# Patient Record
Sex: Male | Born: 1935 | Race: White | Hispanic: No | Marital: Married
Health system: Southern US, Community
[De-identification: ages and names within clinical notes are randomized; demographics above are authoritative.]

---

## 2003-05-29 ENCOUNTER — Other Ambulatory Visit: Payer: Self-pay

## 2005-12-02 ENCOUNTER — Ambulatory Visit: Payer: Self-pay | Admitting: *Deleted

## 2005-12-16 ENCOUNTER — Ambulatory Visit: Payer: Self-pay | Admitting: *Deleted

## 2005-12-18 ENCOUNTER — Ambulatory Visit: Payer: Self-pay | Admitting: *Deleted

## 2006-11-10 ENCOUNTER — Ambulatory Visit: Payer: Self-pay | Admitting: Family Medicine

## 2006-12-28 ENCOUNTER — Ambulatory Visit: Payer: Self-pay | Admitting: Physician Assistant

## 2007-04-18 ENCOUNTER — Emergency Department: Payer: Self-pay | Admitting: Emergency Medicine

## 2007-08-31 ENCOUNTER — Ambulatory Visit: Payer: Self-pay | Admitting: Family Medicine

## 2007-09-07 ENCOUNTER — Ambulatory Visit: Payer: Self-pay | Admitting: Family Medicine

## 2009-01-07 ENCOUNTER — Encounter: Admission: RE | Admit: 2009-01-07 | Discharge: 2009-01-07 | Payer: Self-pay | Admitting: Neurosurgery

## 2009-01-15 ENCOUNTER — Encounter: Admission: RE | Admit: 2009-01-15 | Discharge: 2009-01-15 | Payer: Self-pay | Admitting: Neurosurgery

## 2009-01-20 ENCOUNTER — Emergency Department: Payer: Self-pay | Admitting: Internal Medicine

## 2009-02-07 ENCOUNTER — Ambulatory Visit (HOSPITAL_COMMUNITY): Admission: RE | Admit: 2009-02-07 | Discharge: 2009-02-07 | Payer: Self-pay | Admitting: Neurosurgery

## 2009-02-08 ENCOUNTER — Inpatient Hospital Stay: Payer: Self-pay | Admitting: Vascular Surgery

## 2009-04-12 ENCOUNTER — Inpatient Hospital Stay (HOSPITAL_COMMUNITY): Admission: RE | Admit: 2009-04-12 | Discharge: 2009-04-18 | Payer: Self-pay | Admitting: Neurosurgery

## 2009-04-12 ENCOUNTER — Encounter (INDEPENDENT_AMBULATORY_CARE_PROVIDER_SITE_OTHER): Payer: Self-pay | Admitting: Neurosurgery

## 2009-05-26 ENCOUNTER — Emergency Department: Payer: Self-pay | Admitting: Unknown Physician Specialty

## 2010-01-09 IMAGING — CT CT ABD-PELV W/O CM
1 series · 15 of 32 positions shown, 19 images · non-contrast
Comparison: none

REASON FOR EXAM: (1) bad epigastric paub hi wbc count hi creatnine; (2)
pain
COMMENTS:

PROCEDURE:     CT  - CT ABDOMEN AND PELVIS W[DATE]  [DATE]
RESULT:     [place] CT abdomen and pelvis
HISTORY: Pain, elevated white blood count.

[Series 2: stone · axial · 0.69mm/px · z∈[-982,-538]mm · 15 of 165 slices shown, 19 images]
[im 11/165  soft-tissue]
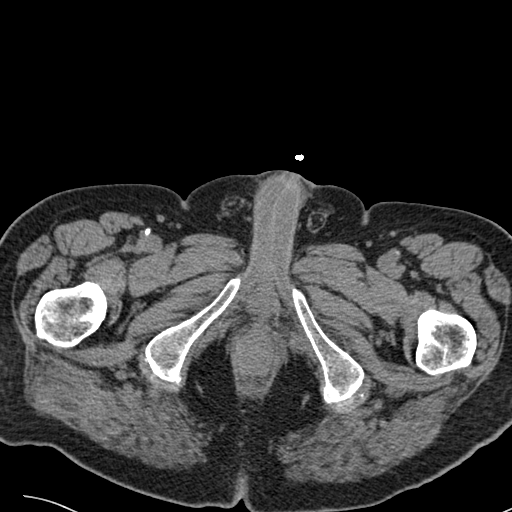
[im 11/165  bone]
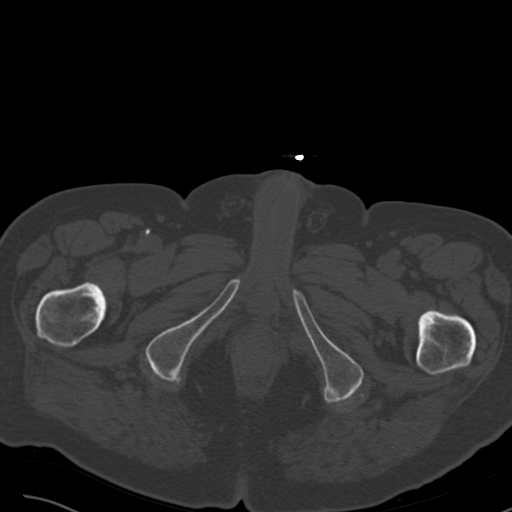
[im 22/165  soft-tissue]
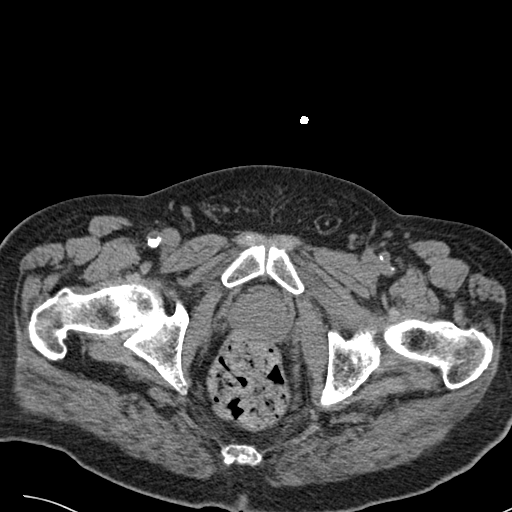
[im 32/165  soft-tissue]
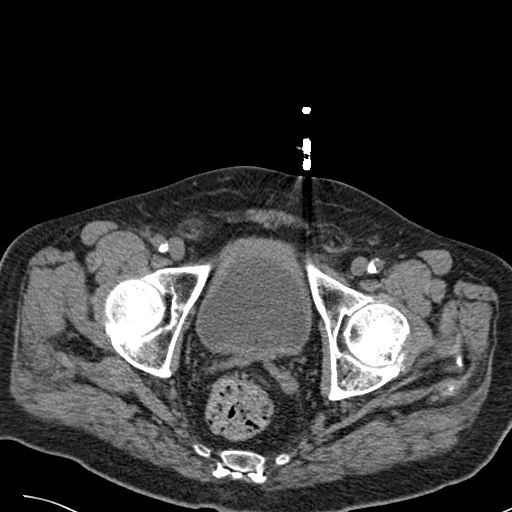
[im 48/165  soft-tissue]
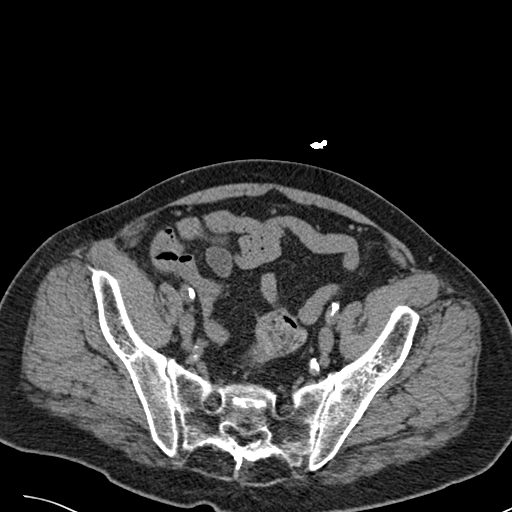
[im 59/165  soft-tissue]
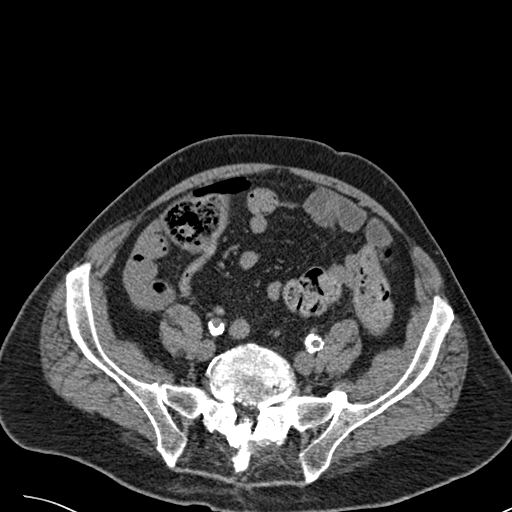
[im 69/165  soft-tissue]
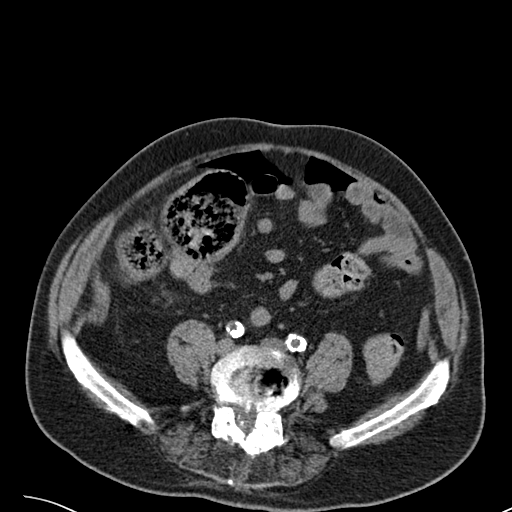
[im 85/165  soft-tissue]
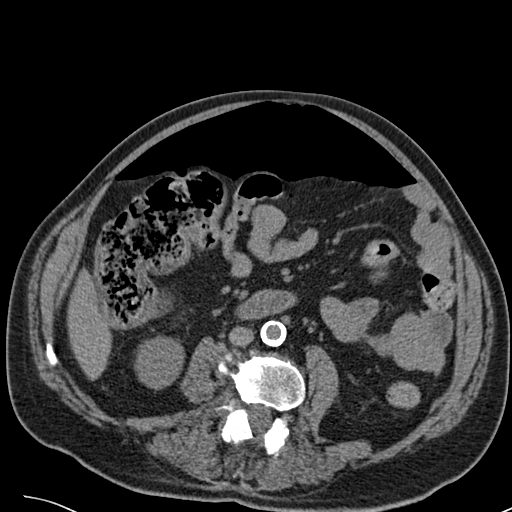
[im 96/165  soft-tissue]
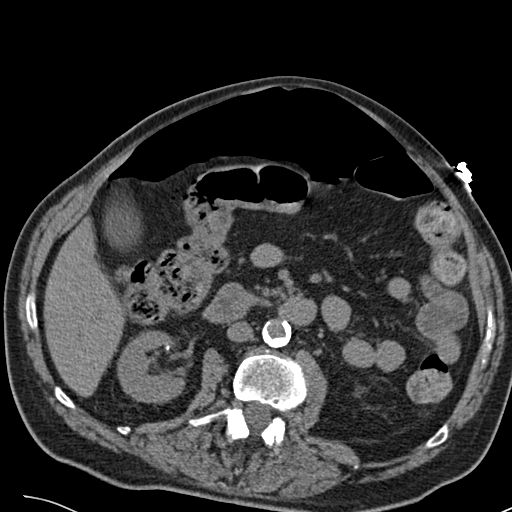
[im 106/165  soft-tissue]
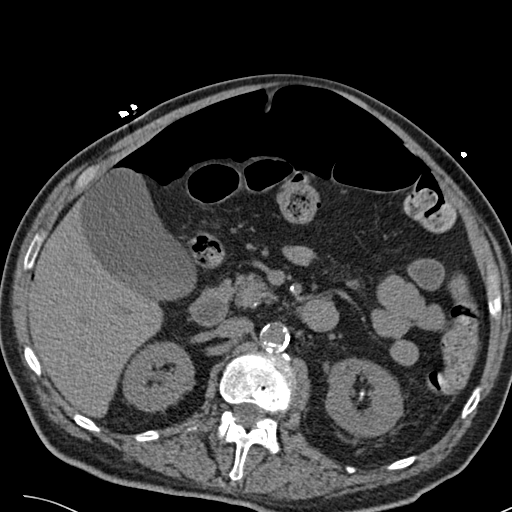
[im 106/165  bone]
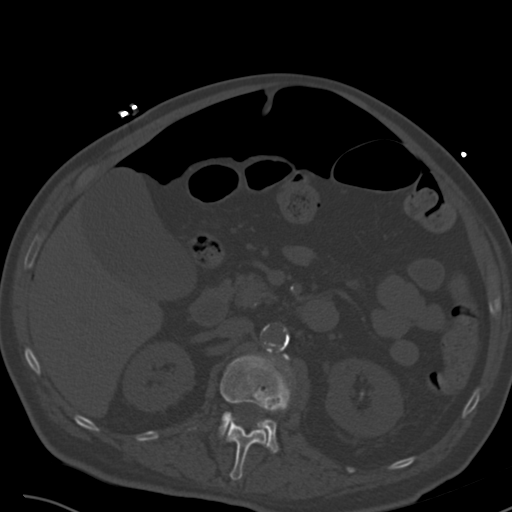
[im 117/165  soft-tissue]
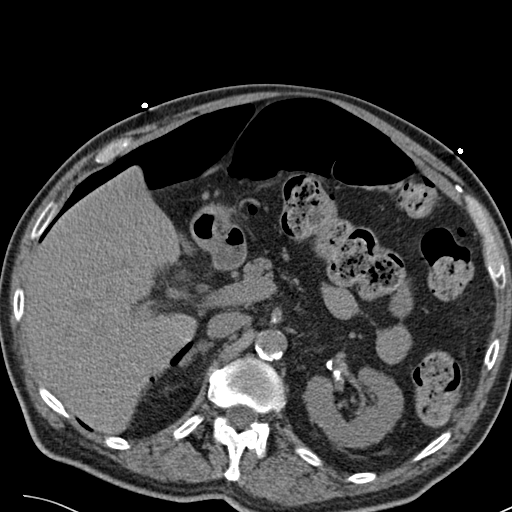
[im 133/165  soft-tissue]
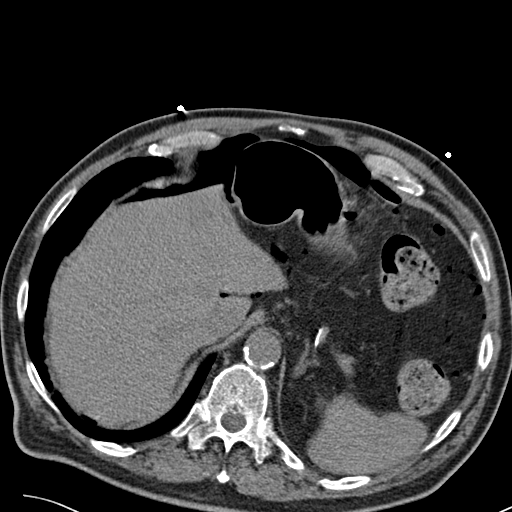
[im 143/165  soft-tissue]
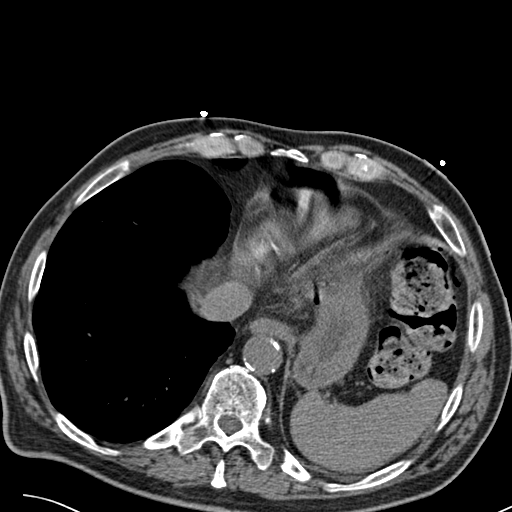
[im 143/165  lung]
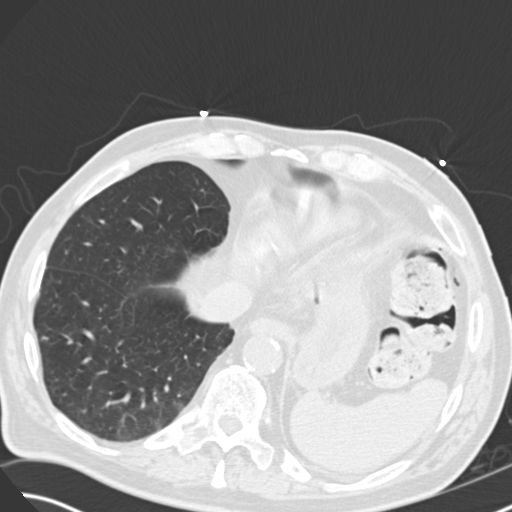
[im 149/165  lung]
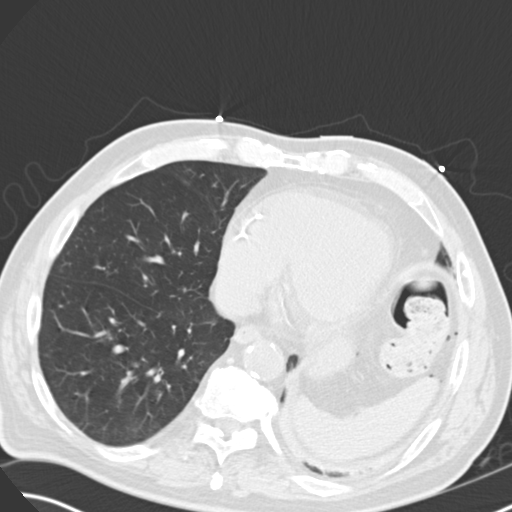
[im 154/165  soft-tissue]
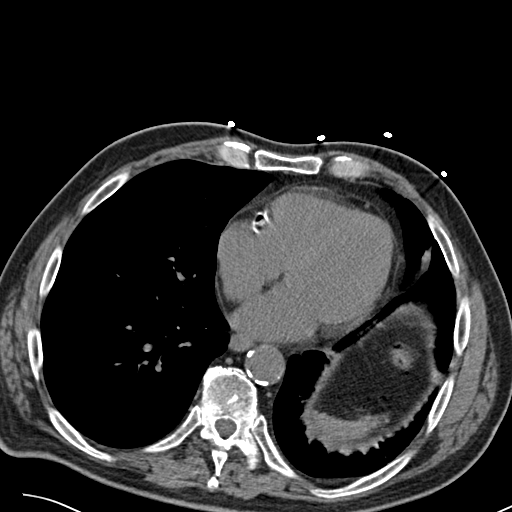
[im 154/165  lung]
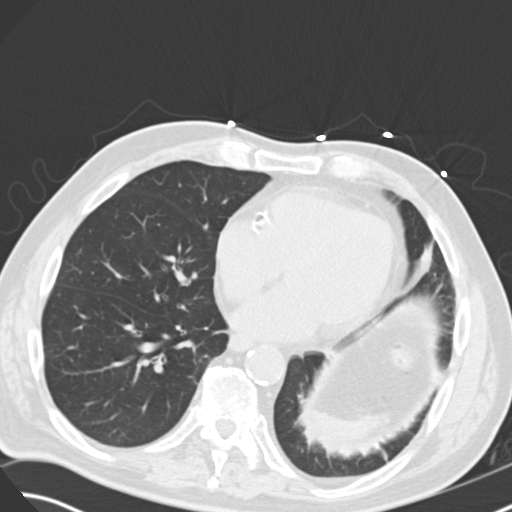
[im 159/165  lung]
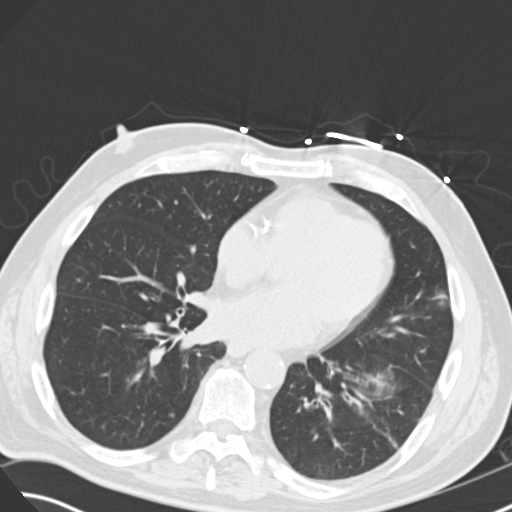

[15 of 32 positions shown; findings below may reference images not displayed]

Procedure: Standard nonenhanced CT of the abdomen and pelvis obtained. Liver
is normal. Gallbladder is distended and contains stones and/or sludge.
Gallbladder wall thickness is normal. Spleen is normal. Bilateral
nephrolithiasis is present. No hydronephrosis noted. Appendix is normal. No
inguinal adenopathy noted. Bladder nondistended. Aortoiliac stents are
noted. Basilar atelectasis present. Free intraperitoneal air is noted. This
suggests the possibility of bowel rupture. This exam was evaluated on
separate workstation using [employer].
IMPRESSION: Free intraperitoneal air. Aortoiliac vascular disease is
present, the patient has aorto iliac stents, also noted is mesenteric
vascular disease and renal vascular disease. No prominent bowel distention
or bowel wall thickening noted.

2. Gallbladder distention with sludge and/or gallstones. This report was
phoned to the patient's physician at the time of study.

## 2010-02-14 ENCOUNTER — Ambulatory Visit: Payer: Self-pay | Admitting: Family Medicine

## 2010-02-26 ENCOUNTER — Ambulatory Visit: Payer: Self-pay | Admitting: Family Medicine

## 2010-03-06 ENCOUNTER — Ambulatory Visit: Payer: Self-pay | Admitting: Vascular Surgery

## 2010-03-19 ENCOUNTER — Ambulatory Visit: Payer: Self-pay | Admitting: Vascular Surgery

## 2010-04-02 ENCOUNTER — Inpatient Hospital Stay: Payer: Self-pay | Admitting: Vascular Surgery

## 2010-08-18 ENCOUNTER — Encounter: Payer: Self-pay | Admitting: Neurosurgery

## 2010-10-16 ENCOUNTER — Ambulatory Visit: Payer: Self-pay | Admitting: Otolaryngology

## 2010-10-20 ENCOUNTER — Ambulatory Visit: Payer: Self-pay | Admitting: Otolaryngology

## 2010-10-31 LAB — BASIC METABOLIC PANEL
BUN: 31 mg/dL — ABNORMAL HIGH (ref 6–23)
Chloride: 101 mEq/L (ref 96–112)
Creatinine, Ser: 1.47 mg/dL (ref 0.4–1.5)
GFR calc Af Amer: 57 mL/min — ABNORMAL LOW (ref >60)
GFR calc non Af Amer: 47 mL/min — ABNORMAL LOW (ref >60)

## 2010-10-31 LAB — CBC
MCV: 98 fL (ref 78.0–100.0)
Platelets: 194 10*3/uL (ref 150–400)
RBC: 3.81 MIL/uL — ABNORMAL LOW (ref 4.22–5.81)
WBC: 7.7 10*3/uL (ref 4.0–10.5)

## 2010-10-31 LAB — GLUCOSE, CAPILLARY
Glucose-Capillary: 107 mg/dL — ABNORMAL HIGH (ref 70–99)
Glucose-Capillary: 117 mg/dL — ABNORMAL HIGH (ref 70–99)
Glucose-Capillary: 129 mg/dL — ABNORMAL HIGH (ref 70–99)
Glucose-Capillary: 131 mg/dL — ABNORMAL HIGH (ref 70–99)
Glucose-Capillary: 93 mg/dL (ref 70–99)

## 2010-10-31 LAB — DIFFERENTIAL
Lymphocytes Relative: 29 % (ref 12–46)
Lymphs Abs: 2.2 10*3/uL (ref 0.7–4.0)
Monocytes Relative: 9 % (ref 3–12)
Neutro Abs: 3.8 10*3/uL (ref 1.7–7.7)
Neutrophils Relative %: 50 % (ref 43–77)

## 2010-11-02 LAB — CBC
HCT: 32.6 % — ABNORMAL LOW (ref 39.0–52.0)
Hemoglobin: 11.7 g/dL — ABNORMAL LOW (ref 13.0–17.0)
MCHC: 35.9 g/dL (ref 30.0–36.0)
MCV: 98.5 fL (ref 78.0–100.0)
RBC: 3.31 MIL/uL — ABNORMAL LOW (ref 4.22–5.81)
RDW: 12.6 % (ref 11.5–15.5)

## 2010-11-02 LAB — BASIC METABOLIC PANEL
CO2: 23 mEq/L (ref 19–32)
Calcium: 9.7 mg/dL (ref 8.4–10.5)
Chloride: 105 mEq/L (ref 96–112)
GFR calc Af Amer: 49 mL/min — ABNORMAL LOW (ref >60)
Glucose, Bld: 107 mg/dL — ABNORMAL HIGH (ref 70–99)
Potassium: 4.5 mEq/L (ref 3.5–5.1)
Sodium: 136 mEq/L (ref 135–145)

## 2010-12-16 ENCOUNTER — Ambulatory Visit: Payer: Self-pay | Admitting: Neurology

## 2012-01-08 LAB — URINALYSIS, COMPLETE
Bilirubin,UR: NEGATIVE
Hyaline Cast: 6
Ketone: NEGATIVE
Nitrite: NEGATIVE
Squamous Epithelial: NONE SEEN

## 2012-01-08 LAB — COMPREHENSIVE METABOLIC PANEL
Albumin: 3.3 g/dL — ABNORMAL LOW (ref 3.4–5.0)
Chloride: 103 mmol/L (ref 98–107)
Co2: 32 mmol/L (ref 21–32)
Creatinine: 1.22 mg/dL (ref 0.60–1.30)
EGFR (African American): >60
EGFR (Non-African Amer.): 57 — ABNORMAL LOW
Glucose: 164 mg/dL — ABNORMAL HIGH (ref 65–99)
Osmolality: 288 (ref 275–301)
SGOT(AST): 19 U/L (ref 15–37)
Sodium: 141 mmol/L (ref 136–145)
Total Protein: 6.1 g/dL — ABNORMAL LOW (ref 6.4–8.2)

## 2012-01-08 LAB — CBC
HCT: 38.4 % — ABNORMAL LOW (ref 40.0–52.0)
HGB: 13.4 g/dL (ref 13.0–18.0)
MCH: 33.4 pg (ref 26.0–34.0)
MCHC: 35 g/dL (ref 32.0–36.0)
Platelet: 147 10*3/uL — ABNORMAL LOW (ref 150–440)
RBC: 4.03 10*6/uL — ABNORMAL LOW (ref 4.40–5.90)
RDW: 12.5 % (ref 11.5–14.5)

## 2012-01-08 LAB — MAGNESIUM: Magnesium: 2 mg/dL

## 2012-01-08 LAB — TROPONIN I: Troponin-I: <0.02 ng/mL

## 2012-01-08 LAB — CK TOTAL AND CKMB (NOT AT ARMC): CK-MB: 1.8 ng/mL (ref 0.5–3.6)

## 2012-01-09 LAB — LIPID PANEL
Cholesterol: 171 mg/dL (ref 0–200)
HDL Cholesterol: 64 mg/dL — ABNORMAL HIGH (ref 40–60)
Ldl Cholesterol, Calc: 78 mg/dL (ref 0–100)
Triglycerides: 143 mg/dL (ref 0–200)
VLDL Cholesterol, Calc: 29 mg/dL (ref 5–40)

## 2012-01-09 LAB — TSH: Thyroid Stimulating Horm: 2.5 u[IU]/mL

## 2012-01-11 LAB — AMMONIA: Ammonia, Plasma: 31 mcmol/L (ref 11–32)

## 2012-01-11 LAB — FOLATE: Folic Acid: 90.6 ng/mL (ref 3.1–100.0)

## 2012-01-11 LAB — HEPATIC FUNCTION PANEL A (ARMC)
Bilirubin, Direct: 0.2 mg/dL (ref 0.00–0.20)
Bilirubin,Total: 1.1 mg/dL — ABNORMAL HIGH (ref 0.2–1.0)
SGPT (ALT): 18 U/L
Total Protein: 5.7 g/dL — ABNORMAL LOW (ref 6.4–8.2)

## 2012-01-12 ENCOUNTER — Inpatient Hospital Stay: Payer: Self-pay | Admitting: Internal Medicine

## 2012-04-26 DEATH — deceased

## 2014-11-18 NOTE — Consult Note (Signed)
General Aspect carotid stenosis    Present Illness The patient is a 81 year old male with a history of carotid stenosis, status post right CEA, hypertension, diabetes, hyperlipidemia, bladder cancer, presented to the ED with confusion and difficulty with speach. Apparently per his wife at the time of admission he  was confused and unable to remember things. In addition, the patient has had slurred speech. He was noted to have high blood pressure with systolic pressures greater than 200 today. No loss of consciousness, or incontinence was noted. He never had a stroke before.  The patient denies AF or focal motor deficits.  PAST MEDICAL HISTORY:  1. Hypertension. 2. Diabetes. 3. Hyperlipidemia. 4. Right-sided carotid artery stenosis, status post surgery.  5. Bladder cancer.   PAST SURGICAL HISTORY:  1. Back surgery.  2. Knee surgery. 3. Aortoiliac stent.  4. RightCarotid Endarterectomy.   Home Medications: Medication Instructions Status  multivitamin 1 tab(s) orally once a day Active  aspirin 81 mg oral enteric coated tablet 1 tab(s) orally once a day Active  gabapentin 300 mg oral capsule 1 cap(s) orally 3 times a day Active  donepezil 10 mg oral tablet 1 tab(s) orally once a day Active  ibuprofen 200 mg oral tablet 2 tab(s) orally 3 times a day Active  pravastatin 80 mg oral tablet 1 tab(s) orally once a day Active  lorazepam 1 mg oral tablet 1/4 tab(s) orally 3 times a day Active  Flomax 0.4 mg oral capsule 1 cap(s) orally once a day Active  loratadine 10 mg oral tablet 1 tab(s) orally once a day Active  Dulcolax Stool Softener 100 mg oral capsule 2 cap(s) orally once a day Active  metformin 500 mg oral tablet 1 tab(s) orally once a day Active  chlorthalidone 25 mg oral tablet 1 tab(s) orally once a day Active  clonidine 0.1 mg oral tablet 1 tab(s) orally every 8 hours Active  30 l-methyl 103m 1 tab(s) orally once a day Active    Morphine: Hallucinations  Oxycodone:  Hallucinations  Tramadol: Hallucinations  Crestor: Unknown  atorvastatin: Unknown  Case History:   Family History Non-Contributory    Social History negative tobacco, negative ETOH, negative Illicit drugs   Review of Systems:   Fever/Chills No    Cough No    Sputum No    Abdominal Pain No    Diarrhea No    Constipation No    Nausea/Vomiting No    SOB/DOE No    Chest Pain No    Telemetry Reviewed NSR    Dysuria No   Physical Exam:   GEN well developed, well nourished, no acute distress    HEENT pink conjunctivae, PERRL, moist oral mucosa    NECK supple  trachea midline  right CEA well healed incision    RESP normal resp effort  no use of accessory muscles    CARD regular rate  positive carotid bruits  no JVD    ABD denies tenderness  soft    EXTR negative cyanosis/clubbing, negative edema    SKIN normal to palpation, No rashes, No ulcers    NEURO follows commands, aphasic, motor/sensory function intact    PSYCH alert, good insight   Nursing/Ancillary Notes: **Vital Signs.:   15-Jun-13 11:40   Vital Signs Type Routine   Temperature Temperature (F) 98   Celsius 36.6   Temperature Source oral   Pulse Pulse 65   Pulse source per vital sign device   Respirations Respirations 18   Systolic BP  Systolic BP 086   Diastolic BP (mmHg) Diastolic BP (mmHg) 67   Mean BP 102   BP Source vital sign device   Pulse Ox % Pulse Ox % 97   Pulse Ox Activity Level  At rest   Oxygen Delivery Room Air/ 21 %   Thyroid:  15-Jun-13 05:31    Thyroid Stimulating Hormone 2.50 (0.45-4.50 (International Unit)  ----------------------- Pregnant patients have  different reference  ranges for TSH:  - - - - - - - - - -  Pregnant, first trimetser:  0.36 - 2.50 uIU/mL)  Hepatic:  14-Jun-13 09:35    Bilirubin, Total 0.9   Alkaline Phosphatase 70   SGPT (ALT) 21 (12-78 NOTE: NEW REFERENCE RANGE 06/19/2011)   SGOT (AST) 19   Total Protein, Serum  6.1   Albumin,  Serum  3.3  Routine Chem:  14-Jun-13 09:35    Magnesium, Serum 2.0 (1.8-2.4 THERAPEUTIC RANGE: 4-7 mg/dL TOXIC: > 10 mg/dL  -----------------------)   Glucose, Serum  164   BUN  20   Creatinine (comp) 1.22   Sodium, Serum 141   Potassium, Serum 3.5   Chloride, Serum 103   CO2, Serum 32   Calcium (Total), Serum 9.4   Osmolality (calc) 288   eGFR (African American) >60   eGFR (Non-African American)  57 (eGFR values <65m/min/1.73 m2 may be an indication of chronic kidney disease (CKD). Calculated eGFR is useful in patients with stable renal function. The eGFR calculation will not be reliable in acutely ill patients when serum creatinine is changing rapidly. It is not useful in  patients on dialysis. The eGFR calculation may not be applicable to patients at the low and high extremes of body sizes, pregnant women, and vegetarians.)   Anion Gap  6  15-Jun-13 05:31    Hemoglobin A1c ([facility]) 5.7 (The American Diabetes Association recommends that a primary goal of therapy should be <7% and that physicians should reevaluate the treatment regimen in patients with HbA1c values consistently >8%.)   Cholesterol, Serum 171   Triglycerides, Serum 143   HDL (INHOUSE)  64   VLDL Cholesterol Calculated 29   LDL Cholesterol Calculated 78 (Result(s) reported on 09 Jan 2012 at 06:56AM.)  Cardiac:  14-Jun-13 09:35    CK, Total 51   CPK-MB, Serum 1.8 (Result(s) reported on 08 Jan 2012 at 10:30AM.)   Troponin I < 0.02 (0.00-0.05 0.05 ng/mL or less: NEGATIVE  Repeat testing in 3-6 hrs  if clinically indicated. >0.05 ng/mL: POTENTIAL  MYOCARDIAL INJURY. Repeat  testing in 3-6 hrs if  clinically indicated. NOTE: An increase or decrease  of 30% or more on serial  testing suggests a  clinically important change)  Routine UA:  14-Jun-13 10:54    Color (UA) Yellow   Clarity (UA) Hazy   Glucose (UA) 50 mg/dL   Bilirubin (UA) Negative   Ketones (UA) Negative   Specific Gravity (UA) 1.012    Blood (UA) Negative   pH (UA) 6.0   Protein (UA) >=500   Nitrite (UA) Negative   Leukocyte Esterase (UA) Negative (Result(s) reported on 08 Jan 2012 at 11:33AM.)   RBC (UA) <1 /HPF   WBC (UA) 1 /HPF   Bacteria (UA) NONE SEEN   Epithelial Cells (UA) NONE SEEN   Mucous (UA) PRESENT   Hyaline Cast (UA) 6 /LPF (Result(s) reported on 08 Jan 2012 at 11:33AM.)  Routine Hem:  14-Jun-13 09:35    WBC (CBC) 6.7   RBC (CBC)  4.03  Hemoglobin (CBC) 13.4   Hematocrit (CBC)  38.4   Platelet Count (CBC)  147 (Result(s) reported on 08 Jan 2012 at 10:19AM.)   MCV 96   MCH 33.4   MCHC 35.0   RDW 12.5     Impression 1.  Right carotid stenosis associated with aphasia and  confusion           given the results of the duplex ultrasound the patient should have a CTA to define the right sided lesion and exclude a left sided lesion (given aphasia in a right handed person) 2.  CVA  although the MRI does not show an infarct he continues to have aphasia           continue antiplatelet Rx           plan per [facility] and neurology 3.  Hypertension           continue meds as ordered           avoid hypotension 4.  Hypercholesterolemia           continue statin and fish oil 5.  Diabetes            ADA diet            sliding scale as needed 6.  BPH            continue flomax 7.  Neuropathy            continue neurontin    Plan level 5   Electronic Signatures: Hortencia Pilar (MD)  (Signed 15-Jun-13 17:21)  Authored: General Aspect/Present Illness, Home Medications, Allergies, History and Physical Exam, Vital Signs, Labs, Impression/Plan   Last Updated: 15-Jun-13 17:21 by Hortencia Pilar (MD)

## 2014-11-18 NOTE — H&P (Signed)
PATIENT NAME:  Frank Mcfarland, Frank Mcfarland MR#:  [id number] DATE OF BIRTH:  August 12, 1932  DATE OF ADMISSION:  01/08/2012  PRIMARY CARE PHYSICIAN: Demetrios Isaacs. Sherrie Mustache, MD   REFERRING PHYSICIAN: Malachy Moan, MD    CHIEF COMPLAINT: Confusion, memory loss, and weakness for the past three days.   HISTORY OF PRESENT ILLNESS: The patient is a 81 year old Caucasian male with a history of carotid stenosis, status post carotidectomy, hypertension, diabetes, hyperlipidemia, bladder cancer, presented to the ED with the above chief complaint. The patient is confused but awake, in no acute distress. He could not provide detailed information. According to his wife, the patient was noted to be confused and unable to remember things, and with weakness for the past three days. In addition, the patient has had mild slurred speech. He was noted to have a high blood pressure if more than 200 today. He was treated with clonidine, but the confusion is getting worse after taking the medication.  In addition, the patient has bilateral leg weakness; but no syncope, loss of consciousness, or incontinence was noted. He never had a stroke before.   PAST MEDICAL HISTORY:  1. Hypertension. 2. Diabetes. 3. Hyperlipidemia. 4. Right-sided carotid artery stenosis, status post surgery.  5. Bladder cancer.   PAST SURGICAL HISTORY:  1. Back surgery.  2. Knee surgery. 3. Aortoiliac stent.  4. Carotid arterial stenosis, status post carotidectomy.    SOCIAL HISTORY: According to the patient's wife, no smoking or drinking or illicit drugs. He is living with his wife.   FAMILY HISTORY: Mother had a stroke, otherwise unknown  ALLERGIES: Atorvastatin, Crestor, morphine, oxycodone, tramadol.   MEDICATIONS:  1. 30-I methyl 2 mg p.o. once daily. 2. Aspirin 81 mg p.o. daily.  3. Chlorthalidone 25 mg p.o. daily.  4. Clonidine 0.1 mg p.o. every 8 hours.  5. Donepezil 10 mg p.o. daily.  6. Dulcolax stool softener 100 mg, 2 capsules p.o. daily.   7. Flomax 0.4 mg p.o. daily.  8. Gabapentin 300 mg p.o. t.i.d.  9. Ibuprofen 200 mg, 2 tablets p.o. t.i.d.  10. Loratadine 10 mg p.o. once daily.  11. Lorazepam 1 mg p.o., 1/4 tab  p.o. t.i.d.  12. Metformin 500 mg p.o. once daily.  13. Multivitamin 1 tablet p.o. daily.  14. Pravastatin 80 mg p.o. daily.   REVIEW OF SYSTEMS: The patient is confused and unable to provide review of systems.   PHYSICAL EXAMINATION:  VITALS: Temperature 97.6, blood pressure 139/50, pulse 52, respirations 13, respiration 16, oxygen saturation 95% on room air.   GENERAL: The patient is awake, alert but confused, looks lethargic and had some mild slurred speech.   HEENT: Pupils are round, equal, and reactive to light and accommodation. Moist oral mucosa. Clear oropharynx.   NECK: Supple. No JVD but has right-sided carotid bruit. No thyromegaly and no lymphadenopathy.   CARDIOVASCULAR: S1, S2, regular rate and rhythm. No murmurs or gallops.   PULMONARY: Bilateral air entry. No wheezing or rales. No use of accessory muscles to breathe.   ABDOMEN: Soft. No distention or tenderness. No organomegaly. Bowel sounds are present.  EXTREMITIES: No edema, clubbing, or cyanosis. No calf tenderness. Strong bilateral pedal pulses.   NEUROLOGIC: The patient is awake, alert but confused, only limited exam. Power  five out of five bilateral upper extremities but about one out of five bilateral lower extremities. Deep tendon reflexes are mute.   LABORATORY, DIAGNOSTIC AND RADIOLOGICAL DATA:  Urinalysis is negative.  CAT scan of head: No acute intracranial process.  Chest x-ray: Elevated left hemidiaphragm without evidence of acute cardiopulmonary disease. CK 55, CK-MB 1.8.  Glucose 164, BUN 20, creatinine 1.22. Electrolytes are normal.  WBC 6.7, hemoglobin 13.4, platelets 147.  Troponin less than 0.02.  Magnesium 2.0.  EKG showed sinus bradycardia at 55 beats per minute with right bundle branch block.   IMPRESSION:   1. Altered mental status, unclear etiology, need to rule out cerebrovascular accident, also possibly due to medication.  2. Hypertension, uncontrolled.  3. Mild dehydration.  4. Diabetes.  5. Hyperlipidemia.  6. History of right-sided carotid arterial stenosis, status post surgery.   PLAN OF TREATMENT:  1. The patient will be placed for observation. We will keep n.p.o. until swallowing study recommendations.  2. Continue telemetry monitor. We will give IV fluid support but hold lorazepam, gabapentin, and ibuprofen due to altered mental status.  3. To rule out cerebrovascular accident, we will get MRI of the brain without contrast, carotid duplex, echocardiograph and check lipid panel and TSH.  4. We will increase aspirin to 325 mg p.o. daily, continue pravastatin, Lopressor, clonidine and chlorthalidone for uncontrolled high blood pressure.  5. For diabetes, we will start sliding scale, hold metformin and check hemoglobin A1c.   6. GI and deep vein thrombosis prophylaxis.   I discussed the patient's situation and plan of treatment with the patient's wife.  TIME SPENT: About 70 minutes   ____________________________ Shaune PollackQing Drena Ham, MD qc:cbb D: 01/08/2012 14:02:32 ET T: 01/08/2012 15:17:36 ET JOB#: [id number]  cc: Shaune PollackQing Brylea Pita, MD, <Dictator> Demetrios Isaacsonald E. Sherrie MustacheFisher, MD Shaune PollackQING Kiandra Sanguinetti MD ELECTRONICALLY SIGNED 01/10/2012 14:45

## 2014-11-18 NOTE — Discharge Summary (Signed)
PATIENT NAME:  Frank Mcfarland, Frank Mcfarland MR#:  [id number] DATE OF BIRTH:  Sep 15, 1932  DATE OF ADMISSION:  01/08/2012 DATE OF DISCHARGE:  01/13/2012  DISCHARGE DIAGNOSES: 1. Progressive confusion and altered mental status, possibly due to Lewy body dementia and partial seizures in the frontal region. 2. Hypertension. 3. Diabetes. 4. Hyperlipidemia. 5. Peripheral vascular disease with right CEA angiogram done in the hospital showed left internal carotid about 30 to 40% and 50% ostial stenosis. Medical management recommended.   DISPOSITION: The patient is being discharged to a rehab facility.   DIET: Low sodium, ADA diet.   ACTIVITY: As tolerated.   DISCHARGE FOLLOWUP: Followup with Dr. Sherryll BurgerShah, neurologist, in 1 to 2 months. Followup with Dr. Wyn Quakerew and Dr. Sherrie MustacheFisher as an outpatient.   DISCHARGE MEDICATIONS:  1. Multivitamin 1 tablet daily.  2. Neurontin 300 mg three times daily. 3. Aricept 10 mg daily.  4. Pravachol 80 mg daily. 5. Flomax 0.5 mg daily.  6. Dulcolax  5 mg twice a day, hold for loose stools.  7. Metformin 500 mg daily.  8. Chlorthalidone 25 mg daily.  9. Clonidine 0.1 mg every eight hours. 10. Aspirin 325 mg daily.  11. NovoLog insulin sliding scale.  12. Colace 200 mg twice a day, hold for loose stools.  13. Hydralazine 25 mg twice a day, hold for systolic blood pressure less than 90.  14. Ativan 0.25 mg three times daily. 15. Trileptal 300 mg twice a day until 01/14/2012 and then 600 mg twice a day. 16. Symbicort 160/4.5 one puff twice a day. 17. Klonopin 0.5 mg at bedtime. 18. Zofran ODT 4 mg every 6 hours p.r.n.  19. Tylenol 650 mg every six hours p.r.n.   CONSULTANTS:  1. Mellody DrownMatthew Smith, MD - Neurology. 2. Levora DredgeGregory Schnier, MD - Vascular.  RESULTS: EEG showed seizure activity in the left frontal region consistent with simple partial seizures.   Echocardiogram: Normal LVF, ejection fraction 50%, mild MR, mild TR. No apparent source of CVA.   CT of the head without  contrast showed no acute abnormalities.   MRI of the brain showed no evidence of any acute stroke, chronic white matter ischemic changes.   Carotid ultrasound showed 80% stenosis on the right side. There was 80% stenosis on the right side and no hemodynamically significant stenosis on the left side   CT angioma showed considerable plaque throughout the common carotid arteries. Right carotid artery dilates to 1.1 cm at the bulb. Origin of the right internal carotid artery does not demonstrate calcified plaque, but there is mild stenosis in the range of 50%. On the left, there is calcified plaque within the wall of the common carotid artery, especially at the level of the bulb.   Chest x-ray showed no acute abnormalities.   Vitamin B12 was normal. RPR was negative. CBC was normal other than platelet count of 147. Folic acid normal. Glucose [id number]. Hemoglobin A1c 5.7. Ammonia level 31. LDL 78, VLDL 29, cholesterol 171, and triglycerides 143. LFTs normal. TSH normal. Cardiac enzymes normal.  Angiogram done in special procedures showed 30 to 40% stenosis in the left internal carotid and 50% stenosis at the bulb. Medical management was advised.   HOSPITAL COURSE: The patient is a 81 year old male with past medical history of hypertension, diabetes, hyperlipidemia, peripheral vascular disease status post right CEA, and bladder cancer who presented with weakness and confusion. In the past, the patient had been diagnosed with Lewy body dementia by Dr. Sherryll BurgerShah and had been started on Aricept. He  was admitted to the hospital and had extensive work-up including CT of the head, MRI of the brain, and echo all of which were essentially normal. His RPR, B12, folate, TSH, and LFTs were also normal; however, the patient continued to have baseline confusion. Therefore, a neurology consultation with Dr. Katrinka Blazing was obtained who felt the patient possibly had Lewy body dementia. He ordered an EEG which showed simple partial  seizures in the frontal region. Dr. Katrinka Blazing recommended starting the patient on Trileptal and Klonopin and avoid neuroleptics. The patient should follow up with Dr. Sherryll Burger in 1 to 2 months to discuss initiation of Sinemet. The patient's hypertension was initially uncontrolled. He was started on hydralazine, diuretics, and clonidine with good control. The patient's diabetes remained well controlled during the hospitalization. His hemoglobin A1c is 5.7. He is on a statin therapy and his LDL is at goal. The patient had history of right carotid endarterectomy. Due to his presenting symptoms, a carotid ultrasound was obtained which showed about 80% stenosis on the right side, the site where he had his prior endarterectomy. Therefore, a vascular surgical consultation was obtained who ordered a CT angiogram. The CT angiogram results were ambiguous, therefore, an angiogram was done on the left side which showed 30 to 40% stenosis and 50% stenosis at the ostium. Medical management and outpatient follow-up was recommended by vascular surgery. The patient is being discharged in a stable condition.   TIME SPENT: 45 minutes.  ____________________________ Darrick Meigs, MD sp:slb D: 01/13/2012 07:32:00 ET T: 01/13/2012 08:11:36 ET JOB#: [id number]  cc: Darrick Meigs, MD, <Dictator> Darrick Meigs MD ELECTRONICALLY SIGNED 01/14/2012 13:18

## 2014-11-18 NOTE — Consult Note (Signed)
Referring Physician:  Demetrios Loll :   Primary Care Physician:  Demetrios Loll : [facility], [address], [place], [place] [address], [place] [phone]  Reason for Consult:  Admit Date: 11-Jan-2012   Chief Complaint: altered mental status   Reason for Consult: behavior changes   History of Present Illness:  History of Present Illness:   81 yo RHD M presents to [facility] secondary to altered mental status for 3 days and being unable to get up.  Family notes that he has a baseline of dementia and is seen by Dr. Manuella Ghazi who placed him on Aricept for possible Lewy Body dementia.  The family notes that he has been on Aricept for the past few months and that this has not changed him.  He currently lives with his wife who does most of his ADL's.  Family notes that baseline, he has memory issues and has difficulty ambulating to the point that he needs a walker however, 3 days ago things got a lot worse.  Family desires to take him back home.  Family also notes that he has had more hallucinations lately and pt is also having more episodes of fighting in his sleep to the point that his wife does not sleep in the same room anymore.  ROS:   General weakness    HEENT no complaints    Lungs no complaints    Cardiac no complaints    GI no complaints    GU no complaints    Musculoskeletal back pain    Extremities no complaints    Skin no complaints    Neuro memory difficulties    Endocrine no complaints    Psych no complaints   Past Medical/Surgical Hx:  COPD:   Asthma:   Depression:   Anxiety:   Peripheral Vascular Disease:   Heart Murmer:   Diabetes Mellitus, Type II (NIDD):   Chronic Renal Insufficiency:   Peripheral Neuropathy:   Stroke:   CONSTIPATION:   cataracts:   Hypercholesterolemia:   HTN:   bladder cancer:   Carotid Endarterectomy:   Back Surgery x 8:   knee surgery:   stents:   Home Medications: Medication Instructions Last Modified Date/Time   multivitamin 1 tab(s) orally once a day 14-Jun-13 10:24  aspirin 81 mg oral enteric coated tablet 1 tab(s) orally once a day 14-Jun-13 10:24  gabapentin 300 mg oral capsule 1 cap(s) orally 3 times a day 14-Jun-13 10:24  donepezil 10 mg oral tablet 1 tab(s) orally once a day 14-Jun-13 10:24  ibuprofen 200 mg oral tablet 2 tab(s) orally 3 times a day 14-Jun-13 10:24  pravastatin 80 mg oral tablet 1 tab(s) orally once a day 14-Jun-13 10:24  lorazepam 1 mg oral tablet 1/4 tab(s) orally 3 times a day 14-Jun-13 10:24  Flomax 0.4 mg oral capsule 1 cap(s) orally once a day 14-Jun-13 10:24  loratadine 10 mg oral tablet 1 tab(s) orally once a day 14-Jun-13 10:24  Dulcolax Stool Softener 100 mg oral capsule 2 cap(s) orally once a day 14-Jun-13 10:24  metformin 500 mg oral tablet 1 tab(s) orally once a day 14-Jun-13 10:24  chlorthalidone 25 mg oral tablet 1 tab(s) orally once a day 14-Jun-13 10:24  clonidine 0.1 mg oral tablet 1 tab(s) orally every 8 hours 14-Jun-13 10:24  30 l-methyl 75m 1 tab(s) orally once a day 14-Jun-13 10:24   Allergies:  Morphine: Hallucinations  Oxycodone: Hallucinations  Tramadol: Hallucinations  Crestor: Unknown  atorvastatin: Unknown  Social/Family History:  Employment Status: retired  Lives With: spouse   Living Arrangements: house   Social History: no tob, no EtOH, no illicits   Family History: n/c   Vital Signs: **Vital Signs.:   17-Jun-13 04:05   Vital Signs Type Routine   Temperature Temperature (F) 98   Celsius 36.6   Temperature Source Oral   Pulse Pulse 49   Pulse source per vital sign device   Respirations Respirations 16   Systolic BP Systolic BP 272   Diastolic BP (mmHg) Diastolic BP (mmHg) 63   Pulse Ox % Pulse Ox % 94   Pulse Ox Activity Level  At rest   Oxygen Delivery Room Air/ 21 %    53:66   Systolic BP Systolic BP 440   Diastolic BP (mmHg) Diastolic BP (mmHg) 62   Pulse Lying Pulse Lying 52   Systolic BP Systolic BP 347   Diastolic  BP (mmHg) Diastolic BP (mmHg) 51   Pulse Pulse Sitting 52   Systolic BP Systolic BP 92   Diastolic BP (mmHg) Diastolic BP (mmHg) 50   Pulse Standing Pulse Standing 51   Pulse Ox % Pulse Ox % 94   Pulse Ox Activity Level  At rest   Oxygen Delivery Room Air/ 21 %   Physical Exam:  General: alert, no acute distress, normal weight   HEENT: normocephalic, sclera nonicteric, oropharynx clear   Neck: supple, no JVD, no bruits   Chest: CTA B, no wheezes, good movement   Cardiac: RRR, no murmurs, no edema, 2+ pulses   Extremities: no C/C/E, FROM   Neurologic Exam:  Mental Status: alert and oriented x 2 not time, normal speech, follows only simple commands,  marked difficulty with naming but normal repetition;  7/30 on MMSE (can only name 3 objects the first time and a few orientation questions);  noted bradyphrenia as well as difficulty with trails   Cranial Nerves: PERRLA, EOMI but slightly hypometric saccades, nl VF, face symmetric but shows hypomimia, tongue midline, shoulder shrug equal   Motor Exam: 5/5 B normal except shoulder adduction limited secondary to L shoulder pain, increased tone in cogwheeling fashion L>R UE as well as LE,  minimal tremor noted and mild asterixis in L hand, + G[place] L>R, bradykinesia R>L   Deep Tendon Reflexes: 1/4 B, down going plantars, +  Glabellar and Moro reflexes   Sensory Exam: decreased vibration and pinprick, in stocking glove distrobution   Coordination: FTN  WNL   Gait: deferred   Lab Results: Thyroid:  15-Jun-13 05:31    Thyroid Stimulating Hormone 2.50 (0.45-4.50 (International Unit)  ----------------------- Pregnant patients have  different reference  ranges for TSH:  - - - - - - - - - -  Pregnant, first trimetser:  0.36 - 2.50 uIU/mL)  Hepatic:  14-Jun-13 09:35    Bilirubin, Total 0.9   Alkaline Phosphatase 70   SGPT (ALT) 21 (12-78 NOTE: NEW REFERENCE RANGE 06/19/2011)   SGOT (AST) 19   Total Protein, Serum  6.1   Albumin,  Serum  3.3  Routine Chem:  14-Jun-13 09:35    Magnesium, Serum 2.0 (1.8-2.4 THERAPEUTIC RANGE: 4-7 mg/dL TOXIC: > 10 mg/dL  -----------------------)   Glucose, Serum  164   BUN  20   Creatinine (comp) 1.22   Sodium, Serum 141   Potassium, Serum 3.5   Chloride, Serum 103   CO2, Serum 32   Calcium (Total), Serum 9.4   Osmolality (calc) 288   eGFR (African American) >60   eGFR (Non-African American)  57 (eGFR values <104m/min/1.73 m2 may be an indication of chronic kidney disease (CKD). Calculated eGFR is useful in patients with stable renal function. The eGFR calculation will not be reliable in acutely ill patients when serum creatinine is changing rapidly. It is not useful in  patients on dialysis. The eGFR calculation may not be applicable to patients at the low and high extremes of body sizes, pregnant women, and vegetarians.)   Anion Gap  6  15-Jun-13 05:31    Hemoglobin A1c ([facility]) 5.7 (The American Diabetes Association recommends that a primary goal of therapy should be <7% and that physicians should reevaluate the treatment regimen in patients with HbA1c values consistently >8%.)   Cholesterol, Serum 171   Triglycerides, Serum 143   HDL (INHOUSE)  64   VLDL Cholesterol Calculated 29   LDL Cholesterol Calculated 78 (Result(s) reported on 09 Jan 2012 at 06:56AM.)  Cardiac:  14-Jun-13 09:35    CK, Total 51   CPK-MB, Serum 1.8 (Result(s) reported on 08 Jan 2012 at 10:30AM.)   Troponin I < 0.02 (0.00-0.05 0.05 ng/mL or less: NEGATIVE  Repeat testing in 3-6 hrs  if clinically indicated. >0.05 ng/mL: POTENTIAL  MYOCARDIAL INJURY. Repeat  testing in 3-6 hrs if  clinically indicated. NOTE: An increase or decrease  of 30% or more on serial  testing suggests a  clinically important change)  Routine UA:  14-Jun-13 10:54    Color (UA) Yellow   Clarity (UA) Hazy   Glucose (UA) 50 mg/dL   Bilirubin (UA) Negative   Ketones (UA) Negative   Specific Gravity (UA) 1.012    Blood (UA) Negative   pH (UA) 6.0   Protein (UA) >=500   Nitrite (UA) Negative   Leukocyte Esterase (UA) Negative (Result(s) reported on 08 Jan 2012 at 11:33AM.)   RBC (UA) <1 /HPF   WBC (UA) 1 /HPF   Bacteria (UA) NONE SEEN   Epithelial Cells (UA) NONE SEEN   Mucous (UA) PRESENT   Hyaline Cast (UA) 6 /LPF (Result(s) reported on 08 Jan 2012 at 11:33AM.)  Routine Hem:  14-Jun-13 09:35    WBC (CBC) 6.7   RBC (CBC)  4.03   Hemoglobin (CBC) 13.4   Hematocrit (CBC)  38.4   Platelet Count (CBC)  147 (Result(s) reported on 08 Jan 2012 at 10:19AM.)   MCV 96   MCH 33.4   MCHC 35.0   RDW 12.5   Radiology Results: [place]    14-Jun-13 17:02, [place]   [place] Bilateral    REASON FOR EXAM:    AMS, h/o carotid stenosis, s/p surgery  COMMENTS:       PROCEDURE: [place] - [place] DOPPLER BILATERAL  - Jan 08 2012  5:02PM     RESULT: Color flow Doppler examination of the cervical carotid arteries   was performed.     There isconsiderable calcified plaque in the distal right CCA. The   waveform patterns here demonstrates spectral broadening. There is   somewhat less prominent plaque in the carotid bulb and proximal ICA on   the right. On the right the peak internal carotid systolic velocity   measured 164 cm per second and the peak common carotid velocity measured   201 cm per second corresponding to a ratio of 0.8.     On the left there is a moderate amount of calcified plaque in the distal   CCA and bulb and proximal ICA. The peak internal carotid systolic  velocity measured 118 cm/sec and the peak common carotid velocity   measured 152 cm/sec corresponding to ratio of 0.8. The vertebral arteries   are normal in flow direction bilaterally.    IMPRESSION:  Visually on the right and based on velocity measurements the   distal right common carotid artery exhibits stenosis up to 80%. The   carotid bulb and proximal ICA on the right do not exhibit  significant   stenosis. On the left I do not see evidence of hemodynamically   significant carotid stenosis. The vertebral arteries are normal in flow   direction bilaterally.     Dictation Site: 5      Verified By: DAVID A. Martinique, M.D., MD  MRI:    15-Jun-13 10:54, MRI Brain Without Contrast   MRI Brain Without Contrast    REASON FOR EXAM:    r/o CVA  COMMENTS:       PROCEDURE: MR  - MR BRAIN WO CONTRAST  - Jan 09 2012 10:54AM     RESULT: Multiplanar images were obtained through the brain without   administration of gadolinium. Comparison is made to a study of 16 Dec 2010.    The diffusion-weighted images exhibit no findings suspicious for acute   ischemia. The inversion recovery-FLAIR sequences reveal large amounts of   increased periventricular white matter signal in both cerebral   hemispheres. There are patchy areas of more peripheral increased signal.   The T1 and T2 weighted images reveal mild diffuse cerebral atrophy with   compensatory ventriculomegaly. There is no shift of the midline. There is     no evidence of acute intracranial hemorrhage. Cranial nerves VII and VIII   appear normal at the level of the cerebellopontine angles. The observed   portions of the paranasal sinuses and mastoid air cells are clear.    IMPRESSION:   1. I see no evidence of acute ischemic or hemorrhagic infarction.  2.There is no evidence of intracranial mass effect nor hydrocephalus.  3. There are white matter signal changes in both cerebral hemispheres   consistent with chronic small vessel ischemia.    Overall there has not been significant interval change in the appearance   of the brain since Dec 16, 2010.     Dictation Site: 5      Verified By: DAVID A. Martinique, M.D., MD  CT:    14-Jun-13 10:03, CT Head Without Contrast   CT Head Without Contrast    REASON FOR EXAM:    lethargy, confusioin  COMMENTS:       PROCEDURE: CT  - CT HEAD WITHOUT CONTRAST  - Jan 08 2012 10:03AM      RESULT: Comparison:  None    Technique: Multiple axial images from the foramen magnum to the vertex   were obtained withoutIV contrast.    Findings:      There is no evidence of mass effect, midline shift, or extra-axial fluid   collections.  There is no evidence of a space-occupying lesion or   intracranial hemorrhage. There is no evidence of a cortical-based area of     acute infarction. There is generalized cerebral atrophy. There is   periventricular white matter low attenuation likely secondary to   microangiopathy.    The ventricles and sulci are appropriate for the patient's age. The basal   cisterns are patent.    Visualized portions of the orbits are unremarkable. The visualized   portions of the paranasal sinuses and mastoid air cells  are unremarkable.   Cerebrovascular atherosclerotic calcifications are noted.    The osseous structures are unremarkable.    IMPRESSION:      No acute intracranial process.        Dictation Site: 1          Verified By: Jennette Banker, M.D., MD   Impression/Recommendations:  Recommendations:   labs reviewed unremarkable personally reviewed by me and shows no acute infarcts or hemorrhages, there is moderate white matter changes in a watershed distrubution, mild atrophy personally reviewed by me and appears to have greater than 50% on L with recent CEA on R d/w referring physician   Altered mental status-  this is very complex in a patient who has baseline dementia;  we are not completely convinced that he is far off of his baseline. We will look for other cause of AMS besides dementia.  This could all just be some type of sundowning. Dementia-  already on Aricept but it has not been completely classified.  Family thinks that it is Lewy Body which does sound appropiate given the clinical context of symptoms. Carotid stenosis-  recent R CEA, L carotid still looks > 50% however, I would not call this symptomatic because these  episodes do not sound like TIA's and there is no evidence of stroke Mild white matter changes-  stable,  control risk factors check EEG check B12/folate, TSH, RPR, ammonia, and LFT's to look for reversible causes d/c claritin as this can slow cognition may benefit from trial of Sinemet tomorrow if additional w/u negative will follow  Electronic Signatures: Jamison Neighbor (MD)  (Signed 17-Jun-13 11:49)  Authored: REFERRING PHYSICIAN, Primary Care Physician, Consult, History of Present Illness, Review of Systems, PAST MEDICAL/SURGICAL HISTORY, HOME MEDICATIONS, ALLERGIES, Social/Family History, NURSING VITAL SIGNS, Physical Exam-, LAB RESULTS, RADIOLOGY RESULTS, Recommendations   Last Updated: 17-Jun-13 11:49 by Jamison Neighbor (MD)

## 2014-11-18 NOTE — Op Note (Signed)
PATIENT NAME:  Frank Mcfarland, Frank Mcfarland MR#:  [id number] DATE OF BIRTH:  24-Sep-1932  DATE OF PROCEDURE:  01/12/2012  PREOPERATIVE DIAGNOSES:  1. TIA.  2. Atherosclerotic occlusive disease bilateral carotid arteries.  POSTOPERATIVE DIAGNOSES: 1. TIA.  2. Atherosclerotic occlusive disease bilateral carotid arteries.  PROCEDURE PERFORMED: Selection left common carotid artery with both cervical and cerebral views of the left carotid system.   PROCEDURE PERFORMED BY: Renford DillsGregory G. Schnier, MD   SEDATION: Versed 1 mg plus fentanyl 50 mcg administered IV. Continuous ECG, pulse oximetry, and cardiopulmonary monitoring is performed throughout the entire procedure by the interventional radiology nurse. Total sedation time was one hour.   ACCESS: 5 French sheath, right common femoral artery.   FLUORO TIME: Approximately three minutes.   CONTRAST USED: Isovue 50 mL.   INDICATIONS: Mr. Frank Mcfarland is a 81 year old gentleman who presented to the hospital with neurological changes and aphasia. Work-up has included duplex ultrasound which demonstrated bilateral carotid stenosis as well as CT angiography which demonstrated moderate stenosis on the right but uncertain findings on the left secondary to a dense calcification in the carotid bulb. He is, therefore, undergoing angiography to determine the degree of stenosis within the left carotid system. Risks and benefits were reviewed. All questions answered. The patient agrees to proceed.   PROCEDURE: The patient is taken to Special Procedures and placed in the supine position. After adequate sedation is achieved, access to the right common femoral artery is obtained with a micropuncture needle, microwire followed micro sheath, J-wire followed by 5 French sheath and 5 French pigtail catheter. Pigtail catheter is positioned in the ascending aorta and LAO projection of the aortic arch is obtained. After review of the images, JB-1 catheter and stiff angled Glidewire are  used to select the left common carotid. Both lateral as well as AP and oblique views of the cervical carotids were obtained. Lateral and Waters views of the intracranial vessels were obtained. After review of these images, the sheath was pulled after image of the right groin demonstrated adequate anatomy and a StarClose device was deployed with success. There are no immediate complications.   INTERPRETATION: The thoracic arch is opacified with a bolus injection of contrast. It is a type II arch. There is diffuse atherosclerotic changes noted. There is a 60% stenosis noted at the origin of the right subclavian. There is diffuse disease noted throughout the right common carotid. At the origin of the left common carotid there is approximately a 50% nonulcerated lesion.   The left carotid bulb and proximal internal carotid artery are heavily calcified by fluoroscopic examination. However, on evaluation with contrast there is approximately a 30 to 40% stenosis noted. Distal internal carotid artery is widely patent as is the carotid siphon. Anterior middle cerebral arteries fill. There is significant distal small vessel disease with pruning of the intracranial distal vessels.   SUMMARY:  1. Less than 80% stenosis of the left internal carotid artery and bulb.  2. 50% stenosis of the origin of the left common carotid.   3. 75 to 80% stenosis of the right origin of the subclavian.   ____________________________ Renford DillsGregory G. Schnier, MD ggs:drc D: 01/12/2012 20:13:38 ET T: 01/13/2012 09:55:37 ET JOB#: [id number]  cc: Renford DillsGregory G. Schnier, MD, <Dictator> Renford DillsGREGORY G SCHNIER MD ELECTRONICALLY SIGNED 01/21/2012 10:03
